# Patient Record
Sex: Male | Born: 1970 | Race: Black or African American | Hispanic: No | Marital: Single | Smoking: Current every day smoker
Health system: Southern US, Community
[De-identification: ages and names within clinical notes are randomized; demographics above are authoritative.]

## PROBLEM LIST (undated history)

## (undated) DIAGNOSIS — I10 Essential (primary) hypertension: Secondary | ICD-10-CM

## (undated) HISTORY — DX: Essential (primary) hypertension: I10

---

## 2014-01-21 ENCOUNTER — Emergency Department: Payer: Self-pay | Admitting: Emergency Medicine

## 2015-06-19 IMAGING — CR DG FOOT COMPLETE 3+V*L*
1 series · 3 of 3 positions shown · non-contrast
Comparison: None.

CLINICAL DATA: Healing ulcer between the third and fourth digits.

EXAM:
LEFT FOOT - COMPLETE 3+ VIEW

[Series 1: ap · 0.17mm/px · 3 of 3 slices shown]
[im 1/3]
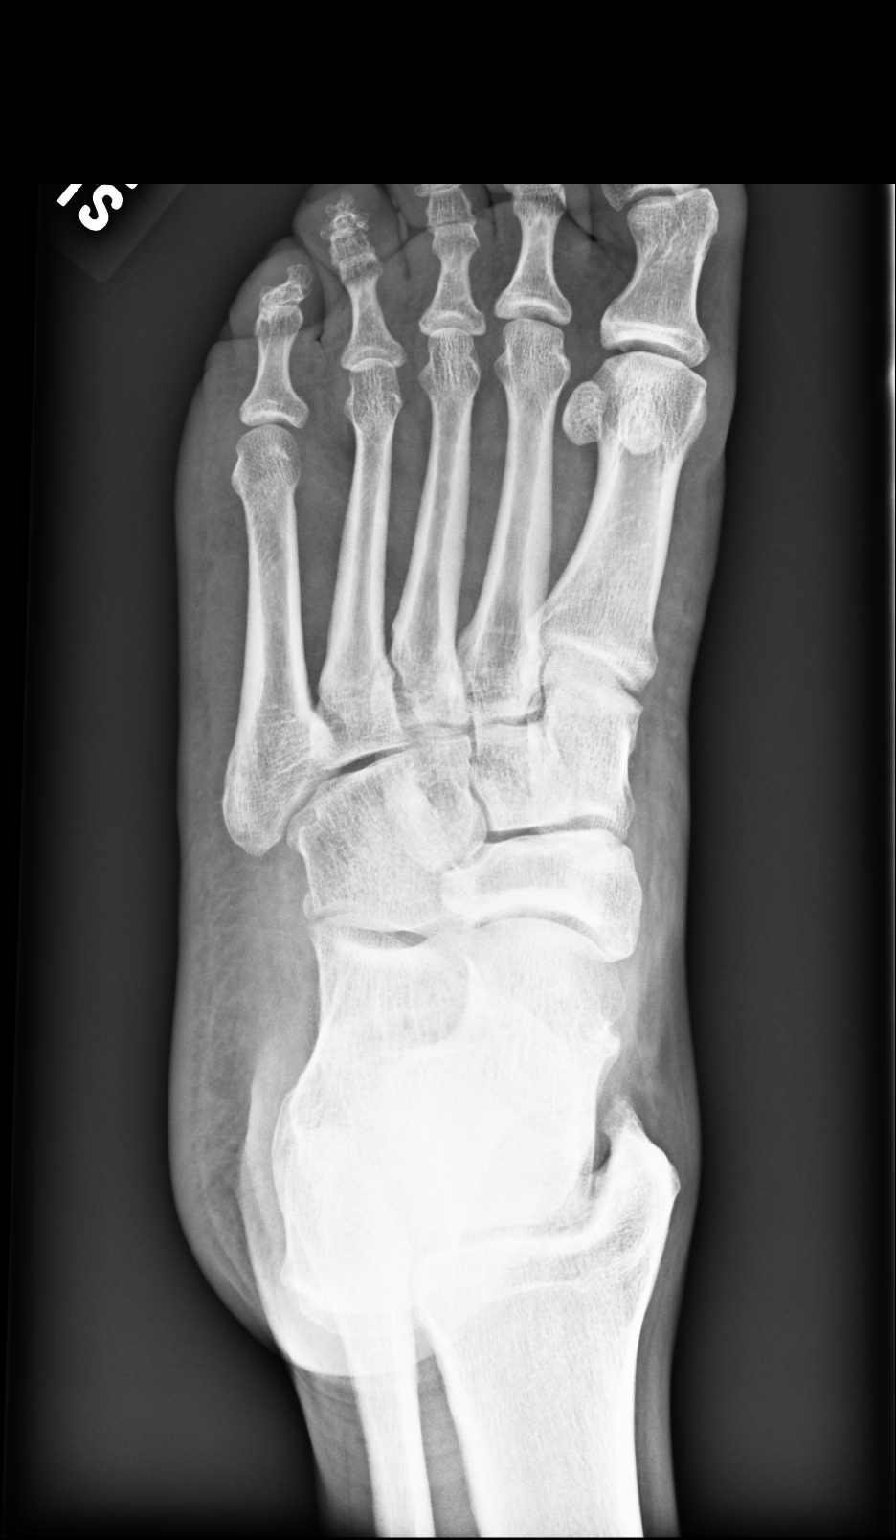
[im 2/3]
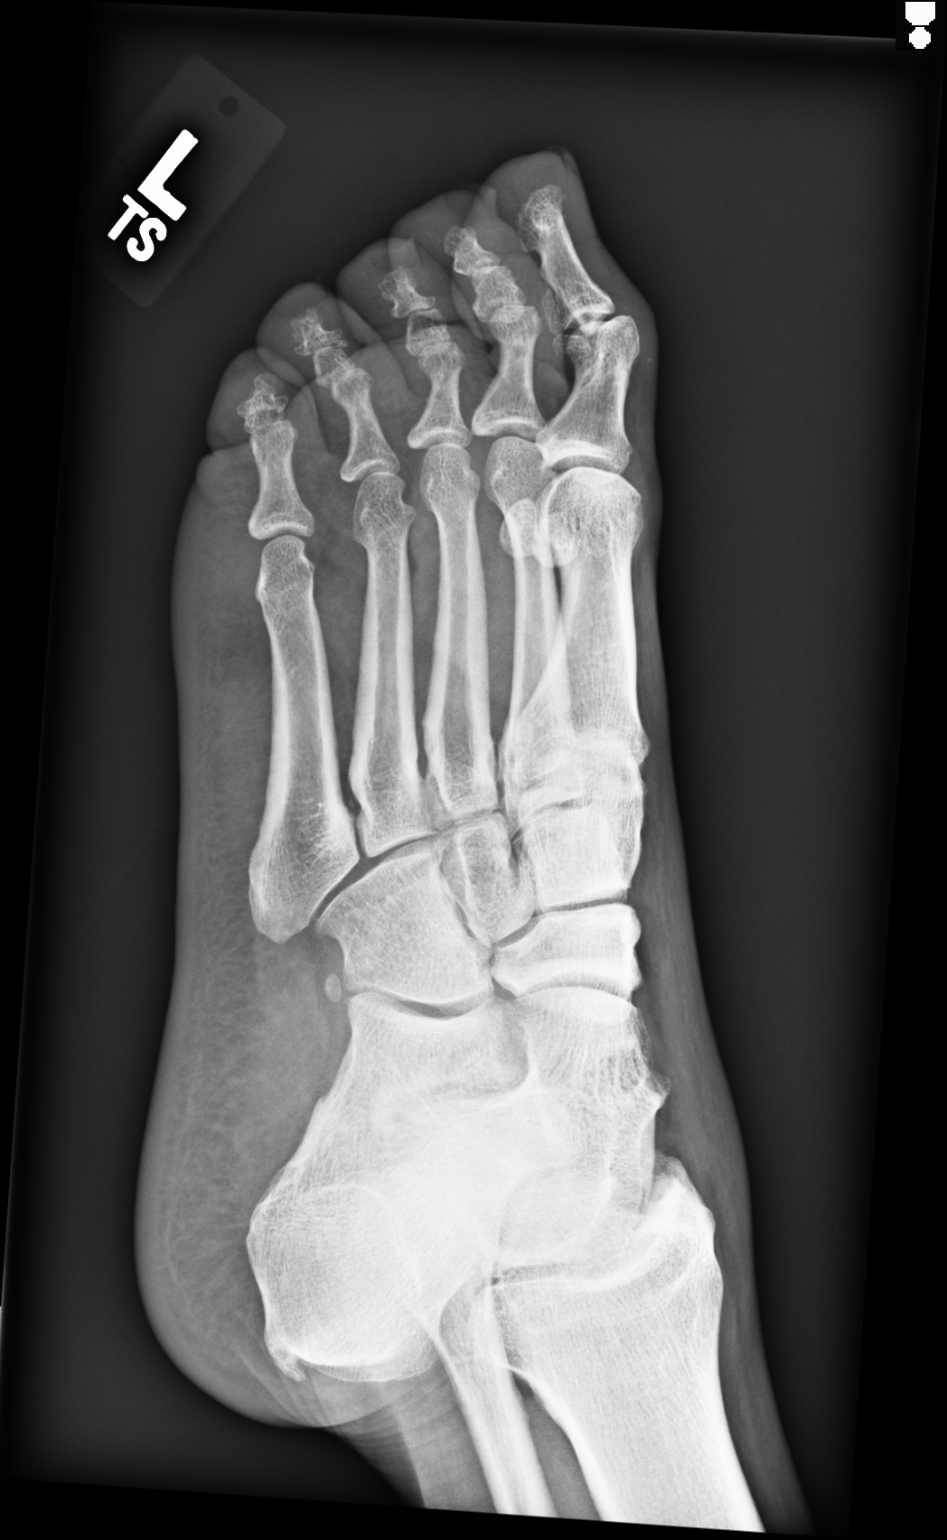
[im 3/3]
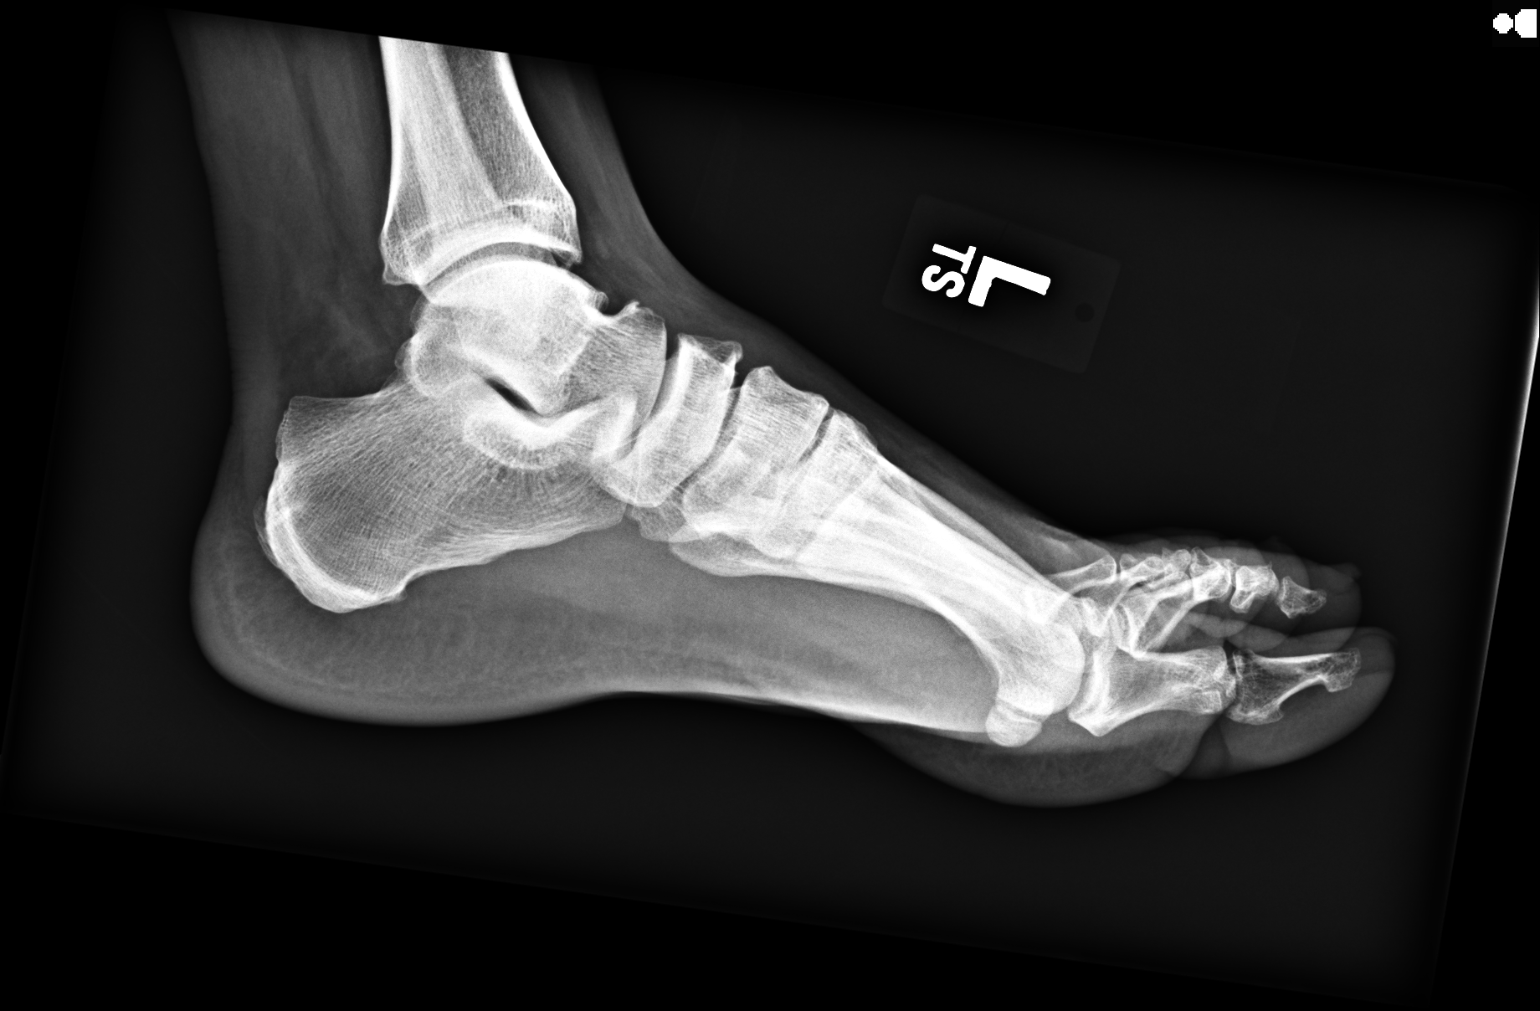

[3 of 3 positions shown; findings below may reference images not displayed]

FINDINGS: There is no evidence of fracture or dislocation. Tiny corticated
bony fragment at fourth distal interphalangeal joint space. Os
perineum. Mild midfoot osteoarthrosis. There is no evidence of
arthropathy or other focal bone abnormality. Soft tissues are
unremarkable.
IMPRESSION: No acute fracture deformity, dislocation or radiographic findings of
osteomyelitis.

Chronic fragmentation of fourth distal interphalangeal joint may
reflect remote injury.

  By: Page Kargbo

## 2017-01-03 ENCOUNTER — Emergency Department
Admission: EM | Admit: 2017-01-03 | Discharge: 2017-01-03 | Disposition: A | Discharge status: Home / Self Care | Payer: No Typology Code available for payment source | Attending: Emergency Medicine | Admitting: Emergency Medicine

## 2017-01-03 ENCOUNTER — Encounter: Payer: Self-pay | Admitting: *Deleted

## 2017-01-03 ENCOUNTER — Emergency Department: Payer: No Typology Code available for payment source

## 2017-01-03 DIAGNOSIS — Y9355 Activity, bike riding: Secondary | ICD-10-CM | POA: Insufficient documentation

## 2017-01-03 DIAGNOSIS — Y998 Other external cause status: Secondary | ICD-10-CM | POA: Insufficient documentation

## 2017-01-03 DIAGNOSIS — T148XXA Other injury of unspecified body region, initial encounter: Secondary | ICD-10-CM | POA: Diagnosis not present

## 2017-01-03 DIAGNOSIS — S20211A Contusion of right front wall of thorax, initial encounter: Secondary | ICD-10-CM

## 2017-01-03 DIAGNOSIS — Y929 Unspecified place or not applicable: Secondary | ICD-10-CM | POA: Insufficient documentation

## 2017-01-03 DIAGNOSIS — S20311A Abrasion of right front wall of thorax, initial encounter: Secondary | ICD-10-CM | POA: Insufficient documentation

## 2017-01-03 DIAGNOSIS — S299XXA Unspecified injury of thorax, initial encounter: Secondary | ICD-10-CM | POA: Diagnosis present

## 2017-01-03 MED ORDER — OXYCODONE-ACETAMINOPHEN 5-325 MG PO TABS: 1.0000 | ORAL_TABLET | Freq: Four times a day (QID) | ORAL | 0 refills | Status: AC | PRN

## 2017-01-03 MED ORDER — OXYCODONE-ACETAMINOPHEN 5-325 MG PO TABS
1.0000 | ORAL_TABLET | Freq: Once | ORAL | Status: AC
  Administered 2017-01-03: 1 via ORAL
  Filled 2017-01-03: qty 1

## 2017-01-03 MED ORDER — IBUPROFEN 600 MG PO TABS: 600.0000 mg | ORAL_TABLET | Freq: Four times a day (QID) | ORAL | 0 refills | Status: DC | PRN

## 2017-01-03 MED ORDER — CYCLOBENZAPRINE HCL 5 MG PO TABS: 5.0000 mg | ORAL_TABLET | Freq: Three times a day (TID) | ORAL | 0 refills | Status: DC | PRN
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
## 2017-01-03 NOTE — ED Notes (Signed)
Pt taken to xray 
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
## 2017-01-03 NOTE — Discharge Instructions (Signed)
Please take medications as prescribed, return to the emergency department for any increase in pain, chest pain, shortness of breath, any worsening symptoms or urgent changes in her health.

## 2022-12-06 ENCOUNTER — Encounter: Payer: Self-pay | Admitting: Physician Assistant

## 2022-12-06 ENCOUNTER — Ambulatory Visit (INDEPENDENT_AMBULATORY_CARE_PROVIDER_SITE_OTHER): Payer: BC Managed Care – PPO | Admitting: Physician Assistant

## 2022-12-06 VITALS — BP 136/84 | HR 97 | Temp 98.3°F | Ht 67.0 in | Wt 207.0 lb

## 2022-12-06 DIAGNOSIS — Z131 Encounter for screening for diabetes mellitus: Secondary | ICD-10-CM

## 2022-12-06 DIAGNOSIS — F17200 Nicotine dependence, unspecified, uncomplicated: Secondary | ICD-10-CM

## 2022-12-06 DIAGNOSIS — Z1321 Encounter for screening for nutritional disorder: Secondary | ICD-10-CM

## 2022-12-06 DIAGNOSIS — I10 Essential (primary) hypertension: Secondary | ICD-10-CM

## 2022-12-06 DIAGNOSIS — R202 Paresthesia of skin: Secondary | ICD-10-CM | POA: Diagnosis not present

## 2022-12-06 NOTE — Progress Notes (Signed)
Date:  12/06/2022   Name:  Brian Armstrong   DOB:  19-Feb-1971   MRN:  [id number]   Chief Complaint: Establish Care, Numbness (X2-3 months, has burning and tingle in toes, both feet, unchanged. Desires diabetes screen), and Hypertension (Has never been on medication for HTN)  HPI Brian Armstrong is a very pleasant 52 year old male with a history of mild HTN never treated who presents new to the clinic today to establish care and address concern for new intermittent paresthesia of the right fourth and fifth toes for the last 2 to 3 months.  He describes the sensation as sometimes numbness, sometimes burning.  No functional loss, not affecting gait  Last physical about 10 years ago, would like to schedule and get up to date on routine screenings   Medication list has been reviewed and updated.  No outpatient medications have been marked as taking for the 12/06/22 encounter (Office Visit) with Remo Lipps, PA.     Review of Systems  Constitutional:  Negative for fatigue and fever.  Respiratory:  Negative for chest tightness and shortness of breath.   Cardiovascular:  Negative for chest pain and palpitations.  Gastrointestinal:  Negative for abdominal pain.  Musculoskeletal:  Negative for gait problem.  Neurological:  Positive for numbness. Negative for weakness.    Patient Active Problem List   Diagnosis Date Noted   Primary hypertension 12/06/2022    No Known Allergies   There is no immunization history on file for this patient.  History reviewed. No pertinent surgical history.  Social History   Tobacco Use   Smoking status: Every Day    Packs/day: 1.00    Years: 29.00    Additional pack years: 0.00    Total pack years: 29.00    Types: Cigarettes, Cigars    Start date: 38    Last attempt to quit: 2014    Years since quitting: 10.3   Smokeless tobacco: Never   Tobacco comments:    Currently 2 cigars daily  Vaping Use   Vaping Use: Never used  Substance Use Topics    Alcohol use: Yes   Drug use: No    Family History  Problem Relation Age of Onset   Hypertension Father    Diabetes Father    Diabetes Paternal Grandfather         12/06/2022   10:01 AM  GAD 7 : Generalized Anxiety Score  Nervous, Anxious, on Edge 0  Control/stop worrying 0  Worry too much - different things 0  Trouble relaxing 0  Restless 0  Easily annoyed or irritable 1  Afraid - awful might happen 0  Total GAD 7 Score 1  Anxiety Difficulty Not difficult at all       12/06/2022   10:01 AM  Depression screen PHQ 2/9  Decreased Interest 1  Down, Depressed, Hopeless 0  PHQ - 2 Score 1  Altered sleeping 0  Tired, decreased energy 0  Change in appetite 1  Feeling bad or failure about yourself  0  Trouble concentrating 0  Moving slowly or fidgety/restless 0  Suicidal thoughts 0  PHQ-9 Score 2  Difficult doing work/chores Not difficult at all    BP Readings from Last 3 Encounters:  12/06/22 136/84  01/03/17 124/76    Wt Readings from Last 3 Encounters:  12/06/22 207 lb (93.9 kg)  01/03/17 235 lb (106.6 kg)    BP 136/84 (BP Location: Left Arm, Patient Position: Sitting, Cuff Size: Large)   Pulse  97   Temp 98.3 F (36.8 C) (Oral)   Ht 5\' 7"  (1.702 m)   Wt 207 lb (93.9 kg)   SpO2 96%   BMI 32.42 kg/m   Physical Exam Vitals and nursing note reviewed.  Constitutional:      Appearance: Normal appearance.  Cardiovascular:     Rate and Rhythm: Normal rate and regular rhythm.     Heart sounds: No murmur heard.    No friction rub. No gallop.     Comments: Pulses 2+ at bilateral carotid, radial, pedal sites. No carotid bruit.  No peripheral edema Pulmonary:     Effort: Pulmonary effort is normal.     Breath sounds: Normal breath sounds. No wheezing or rales.    Diabetic Foot Exam - Simple   Simple Foot Form Diabetic Foot exam was performed with the following findings: Yes 12/06/2022 10:20 AM  Visual Inspection No deformities, no ulcerations, no other skin  breakdown bilaterally: Yes Sensation Testing Intact to touch and monofilament testing bilaterally: Yes Pulse Check Posterior Tibialis and Dorsalis pulse intact bilaterally: Yes Comments Several calluses noted without significant deformity     Recent Labs  No results found for: "NA", "K", "CL", "CO2", "GLUCOSE", "BUN", "CREATININE", "CALCIUM", "PROT", "ALBUMIN", "AST", "ALT", "ALKPHOS", "BILITOT", "GFRNONAA", "GFRAA"  No results found for: "WBC", "HGB", "HCT", "MCV", "PLT" No results found for: "HGBA1C" No results found for: "CHOL", "HDL", "LDLCALC", "LDLDIRECT", "TRIG", "CHOLHDL" No results found for: "TSH"   Assessment and Plan:  1. Paresthesia of right foot Reviewed possible etiologies with patient.  Screen for B12, B9, A1c. - B12 and Folate Panel - Hemoglobin A1c  2. Screening for diabetes mellitus Check A1c - Hemoglobin A1c  3. Encounter for vitamin deficiency screening Check B12 and B9 - B12 and Folate Panel  4. Primary hypertension Blood pressure elevated today.  Patient education attached. Reviewed the "Seven S's" of hypertension including salt, smoking, stimulants (e.g. caffeine), stress, sleep, snoring (OSA), sedentary lifestyle.   Plan to recheck next visit.  If persistently elevated, will initiate treatment.  In the meantime, advised home BP monitoring with a log for me to review next time  - CBC with Differential/Platelet - Comprehensive metabolic panel - TSH  5. Tobacco use disorder Discussed tobacco cessation very likely to help hypertension, may even prevent him from needing medication.  He is not yet ready to quit.  Significant smoking history warrants LDCT which I will order at his physical   Return in about 4 weeks (around 01/03/2023) for nonfasting CPE, HTN.   Partially dictated using Animal nutritionist. Any errors are unintentional.  Alvester Morin, PA-C, DMSc, Nutritionist [facility] [phone]

## 2022-12-06 NOTE — Patient Instructions (Addendum)
-  It was a pleasure to see you today! Please review your visit summary for helpful information -Lab results are usually available within 1-2 days and we will call once reviewed -I would encourage you to follow your care via MyChart where you can access lab results, notes, messages, and more -If you feel that we did a nice job today, please complete your after-visit survey and leave [place] a Google review! Your CMA today was Mariann Barter and your provider was Alvester Morin, PA-C, DMSc -Please return for follow-up in about 4 weeks for physical   Reviewed the "Seven S's" of hypertension including salt, smoking, stimulants (e.g. caffeine), stress, sleep, snoring (OSA), sedentary lifestyle.

## 2022-12-07 LAB — CBC WITH DIFFERENTIAL/PLATELET
Basophils Absolute: 0 10*3/uL (ref 0.0–0.2)
Basos: 1 %
EOS (ABSOLUTE): 0 10*3/uL (ref 0.0–0.4)
Eos: 0 %
Hematocrit: 47 % (ref 37.5–51.0)
Hemoglobin: 16.2 g/dL (ref 13.0–17.7)
Immature Grans (Abs): 0 10*3/uL (ref 0.0–0.1)
Immature Granulocytes: 0 %
Lymphocytes Absolute: 1.3 10*3/uL (ref 0.7–3.1)
Lymphs: 28 %
MCH: 32 pg (ref 26.6–33.0)
MCHC: 34.5 g/dL (ref 31.5–35.7)
MCV: 93 fL (ref 79–97)
Monocytes Absolute: 0.7 10*3/uL (ref 0.1–0.9)
Monocytes: 14 %
Neutrophils Absolute: 2.6 10*3/uL (ref 1.4–7.0)
Neutrophils: 57 %
Platelets: 182 10*3/uL (ref 150–450)
RBC: 5.07 x10E6/uL (ref 4.14–5.80)
RDW: 13.6 % (ref 11.6–15.4)
WBC: 4.7 10*3/uL (ref 3.4–10.8)

## 2022-12-07 LAB — B12 AND FOLATE PANEL
Folate: 7.9 ng/mL (ref >3.0)
Vitamin B-12: 343 pg/mL (ref 232–1245)

## 2022-12-07 LAB — COMPREHENSIVE METABOLIC PANEL
ALT: 13 IU/L (ref 0–44)
AST: 19 IU/L (ref 0–40)
Albumin/Globulin Ratio: 1.7 (ref 1.2–2.2)
Albumin: 4.3 g/dL (ref 3.8–4.9)
Alkaline Phosphatase: 50 IU/L (ref 44–121)
BUN/Creatinine Ratio: 7 — ABNORMAL LOW (ref 9–20)
BUN: 7 mg/dL (ref 6–24)
Bilirubin Total: 0.3 mg/dL (ref 0.0–1.2)
CO2: 23 mmol/L (ref 20–29)
Calcium: 8.9 mg/dL (ref 8.7–10.2)
Chloride: 102 mmol/L (ref 96–106)
Creatinine, Ser: 0.97 mg/dL (ref 0.76–1.27)
Globulin, Total: 2.6 g/dL (ref 1.5–4.5)
Glucose: 112 mg/dL — ABNORMAL HIGH (ref 70–99)
Potassium: 4.7 mmol/L (ref 3.5–5.2)
Sodium: 137 mmol/L (ref 134–144)
Total Protein: 6.9 g/dL (ref 6.0–8.5)
eGFR: 93 mL/min/{1.73_m2} (ref >59)

## 2022-12-07 LAB — TSH: TSH: 0.994 u[IU]/mL (ref 0.450–4.500)

## 2022-12-07 LAB — HEMOGLOBIN A1C
Est. average glucose Bld gHb Est-mCnc: 105 mg/dL
Hgb A1c MFr Bld: 5.3 % (ref 4.8–5.6)

## 2023-01-05 ENCOUNTER — Encounter: Payer: BC Managed Care – PPO | Admitting: Physician Assistant

## 2023-01-10 ENCOUNTER — Encounter: Payer: Self-pay | Admitting: Physician Assistant
